# Patient Record
Sex: Female | Born: 1970 | Race: White | Hispanic: No | Marital: Single | Smoking: Former smoker
Health system: Southern US, Community
[De-identification: ages and names within clinical notes are randomized; demographics above are authoritative.]

## PROBLEM LIST (undated history)

## (undated) DIAGNOSIS — T4145XA Adverse effect of unspecified anesthetic, initial encounter: Secondary | ICD-10-CM

## (undated) DIAGNOSIS — R112 Nausea with vomiting, unspecified: Secondary | ICD-10-CM

## (undated) DIAGNOSIS — Z9889 Other specified postprocedural states: Secondary | ICD-10-CM

## (undated) DIAGNOSIS — I10 Essential (primary) hypertension: Secondary | ICD-10-CM

## (undated) DIAGNOSIS — Z8639 Personal history of other endocrine, nutritional and metabolic disease: Secondary | ICD-10-CM

## (undated) DIAGNOSIS — T8859XA Other complications of anesthesia, initial encounter: Secondary | ICD-10-CM

## (undated) DIAGNOSIS — M1990 Unspecified osteoarthritis, unspecified site: Secondary | ICD-10-CM

---

## 2003-04-05 ENCOUNTER — Encounter: Admission: RE | Admit: 2003-04-05 | Discharge: 2003-05-05 | Payer: Self-pay | Admitting: Occupational Medicine

## 2005-06-12 ENCOUNTER — Emergency Department (HOSPITAL_COMMUNITY): Admission: EM | Admit: 2005-06-12 | Discharge: 2005-06-12 | Payer: Self-pay | Admitting: Emergency Medicine

## 2005-09-17 ENCOUNTER — Emergency Department (HOSPITAL_COMMUNITY): Admission: EM | Admit: 2005-09-17 | Discharge: 2005-09-17 | Payer: Self-pay | Admitting: Family Medicine

## 2006-08-09 ENCOUNTER — Emergency Department: Payer: Self-pay | Admitting: Emergency Medicine

## 2007-01-09 ENCOUNTER — Ambulatory Visit: Payer: Self-pay | Admitting: Emergency Medicine

## 2008-02-27 ENCOUNTER — Ambulatory Visit: Payer: Self-pay | Admitting: Family Medicine

## 2008-02-29 ENCOUNTER — Ambulatory Visit: Payer: Self-pay | Admitting: Family Medicine

## 2008-03-02 ENCOUNTER — Ambulatory Visit: Payer: Self-pay | Admitting: Family Medicine

## 2008-04-21 ENCOUNTER — Ambulatory Visit: Payer: Self-pay | Admitting: Family Medicine

## 2009-12-01 ENCOUNTER — Ambulatory Visit: Payer: Self-pay | Admitting: Family Medicine

## 2009-12-01 DIAGNOSIS — J209 Acute bronchitis, unspecified: Secondary | ICD-10-CM | POA: Insufficient documentation

## 2009-12-01 DIAGNOSIS — J019 Acute sinusitis, unspecified: Secondary | ICD-10-CM | POA: Insufficient documentation

## 2010-07-23 NOTE — Assessment & Plan Note (Signed)
Summary: URI/JBB   Vital Signs:  Patient Profile:   40 Years Old Female CC:      head and chest congestion Height:     64.75 inches Weight:      279 pounds Temp:     99.5 degrees F oral Pulse rate:   80 / minute Pulse rhythm:   regular Resp:     20 per minute BP sitting:   134 / 70  (right arm) Cuff size:   large  Vitals Entered By: Providence Crosby LPN (December 01, 2009 12:35 PM)                  Current Allergies: ! PENICILLIN ! SULFA ! CODEINEHistory of Present Illness Reason for visit: cough and congestion Chief Complaint: head and chest congestion History of Present Illness: started sunday last week states i can not go anymore cough productive with phlegm greenish in color now per patient  REVIEW OF SYSTEMS Constitutional Symptoms       Complains of fever, chills, night sweats, weight gain, and fatigue.     Denies weight loss.  Eyes       Denies change in vision, eye pain, eye discharge, glasses, contact lenses, and eye surgery. Ear/Nose/Throat/Mouth       Complains of frequent runny nose, sinus problems, sore throat, and hoarseness.      Denies hearing loss/aids, change in hearing, ear pain, ear discharge, dizziness, frequent nose bleeds, and tooth pain or bleeding.  Respiratory       Complains of dry cough and productive cough.      Denies asthma, bronchitis, and emphysema/COPD.      Comments: shortness of breath Cardiovascular       Denies murmurs, chest pain, and tires easily with exhertion.    Gastrointestinal       Complains of nausea/vomiting and diarrhea.      Denies stomach pain, constipation, blood in bowel movements, and indigestion. Genitourniary       Denies painful urination, blood or discharge from vagina, kidney stones, and loss of urinary control. Neurological       Denies headaches, loss of or changes in sensation, numbness, tngling, tremors, paralysis, seizures, and fainting/blackouts. Musculoskeletal       Denies muscle pain, joint pain, joint  stiffness, decreased range of motion, redness, swelling, muscle weakness, and gout.  Skin       Denies bruising, unusual mles/lumps or sores, and hair/skin or nail changes.  Psych       Denies mood changes, temper/anger issues, anxiety/stress, speech problems, depression, and sleep problems.  Family History: Father: Deceased at age 70- copd; MI CHF Mother: Deceased at age 8 ; HTN, MI, CABG Siblings: 1 BROTHER DECEASED - diabetic; mi; chf 1 sister alive with osteoporosis 4 Brothers alive and well  Social History: Marital Status: Children:  Occupation: Engineer, materials on Engineer, water at [employer] Physical Exam General appearance: obese, well developed, well nourished, no acute distress Head: normocephalic, atraumatic Eyes: conjunctivae and lids normal Pupils: equal, round, reactive to light Ears: inflamed right TM Nasal: swollen red turbinates with congestion Oral/Pharynx: pharyngeal erythema without exudate, uvula midline without deviation Neck: neck supple,  trachea midline, no masses Thyroid: no nodules, masses, tenderness, or enlargement Chest/Lungs: no rales, wheezes, or rhonchi bilateral, breath sounds equal without effort Heart: regular rate and  rhythm, no murmur GU: deferred Extremities: Trace edema Skin: no obvious rashes or lesions MSE: oriented to time, place, and person Assessment New Problems: SINUSITIS, ACUTE (ICD-461.9) BRONCHITIS,  ACUTE (ICD-466.0)   Plan New Medications/Changes: ZOFRAN 4 MG TABS (ONDANSETRON HCL) 1 tablet every 6 to 8 hours as needed nausea  #20 x 0, 12/01/2009, Genella Mech DOXYCYCL HYC  100MG  (DOXYCYCLINE HYCLATE) TAKE 1 CAP PO TWICE A DAY X 10 DAYS  #20 x 0, 12/01/2009, Genella Mech   The patient and/or caregiver has been counseled thoroughly with regard to medications prescribed including dosage, schedule, interactions, rationale for use, and possible side effects and they verbalize understanding.  Diagnoses and expected course of recovery  discussed and will return if not improved as expected or if the condition worsens. Patient and/or caregiver verbalized understanding.  Prescriptions: ZOFRAN 4 MG TABS (ONDANSETRON HCL) 1 tablet every 6 to 8 hours as needed nausea  #20 x 0   Entered and Authorized by:   Kathrynn Running MD   Signed by:   Genella Mech on 12/01/2009   Method used:   Telephoned to ...       [facility] [address] [phone]* (retail)       [address] [place], [place]  [address]       Ph: [phone]       Fax: [phone]   RxID:   [id number] DOXYCYCL HYC  100MG  (DOXYCYCLINE HYCLATE) TAKE 1 CAP PO TWICE A DAY X 10 DAYS  #20 x 0   Entered and Authorized by:   Kathrynn Running MD   Signed by:   Genella Mech on 12/01/2009   Method used:   Telephoned to ...       [facility] [address] [phone]* (retail)       [address], [place]  [address]       Ph: [phone]       Fax: [phone]   RxID:   [id number]  The patient was informed that there is no on-call provider or services available at this clinic during off-hours (when the clinic is closed).  If the patient developed a problem or concern that required immediate attention, the patient was advised to go the the nearest available urgent care or emergency department for medical care.  The patient verbalized understanding.     The risks, benefits and possible side effects were clearly explained and discussed with the patient.  The patient verbalized clear understanding.  The patient was given instructions to return if symptoms don't improve, worsen or new changes develop.  If it is not during clinic hours and the patient cannot get back to this clinic then the patient was told to seek medical care at an available urgent care or emergency department.  The patient verbalized understanding.    The patient's prescriptions were checked for possible interactions and electronically sent to the pharmacy of choice.   I have reviewed the above medical  office visit documention, including diagnoses, history, medications, clinical lists, orders and plan of care.   Rodney Langton, MD, FAAFP  December 17, 2009 Added new allergy or adverse reaction of PENICILLIN - Signed Added new allergy or adverse reaction of SULFA - Signed Added new allergy or adverse reaction of CODEINE - Signed

## 2010-11-19 ENCOUNTER — Inpatient Hospital Stay (INDEPENDENT_AMBULATORY_CARE_PROVIDER_SITE_OTHER)
Admission: RE | Admit: 2010-11-19 | Discharge: 2010-11-19 | Disposition: A | Discharge status: Home / Self Care | Payer: 59 | Source: Ambulatory Visit | Attending: Family Medicine | Admitting: Family Medicine

## 2010-11-19 DIAGNOSIS — H6690 Otitis media, unspecified, unspecified ear: Secondary | ICD-10-CM

## 2011-10-23 ENCOUNTER — Encounter (HOSPITAL_COMMUNITY): Payer: Self-pay | Admitting: Emergency Medicine

## 2011-10-23 ENCOUNTER — Emergency Department (INDEPENDENT_AMBULATORY_CARE_PROVIDER_SITE_OTHER): Payer: 59

## 2011-10-23 ENCOUNTER — Emergency Department (HOSPITAL_COMMUNITY)
Admission: EM | Admit: 2011-10-23 | Discharge: 2011-10-23 | Disposition: A | Discharge status: Home / Self Care | Payer: 59 | Source: Home / Self Care | Attending: Family Medicine | Admitting: Family Medicine

## 2011-10-23 DIAGNOSIS — M79609 Pain in unspecified limb: Secondary | ICD-10-CM

## 2011-10-23 DIAGNOSIS — M79644 Pain in right finger(s): Secondary | ICD-10-CM

## 2011-10-23 HISTORY — DX: Essential (primary) hypertension: I10

## 2011-10-23 IMAGING — CR DG HAND COMPLETE 3+V*R*
3 series · 3 of 3 positions shown · non-contrast
Comparison: No priors.

***ADDENDUM*** CREATED: [DATE] [DATE]

Due to an administrative error, the original exam contained the
incorrect exam description in the header.  The correct description
for this exam is as follows.
DG HAND COMPLETE RIGHT
***END ADDENDUM*** SIGNED BY: WIELAND, M.D.
CLINICAL DATA: Right-sided hand pain.  Pain.
RIGHT HAND - COMPLETE 3+ VIEW

[view not recorded (1 of 3)]
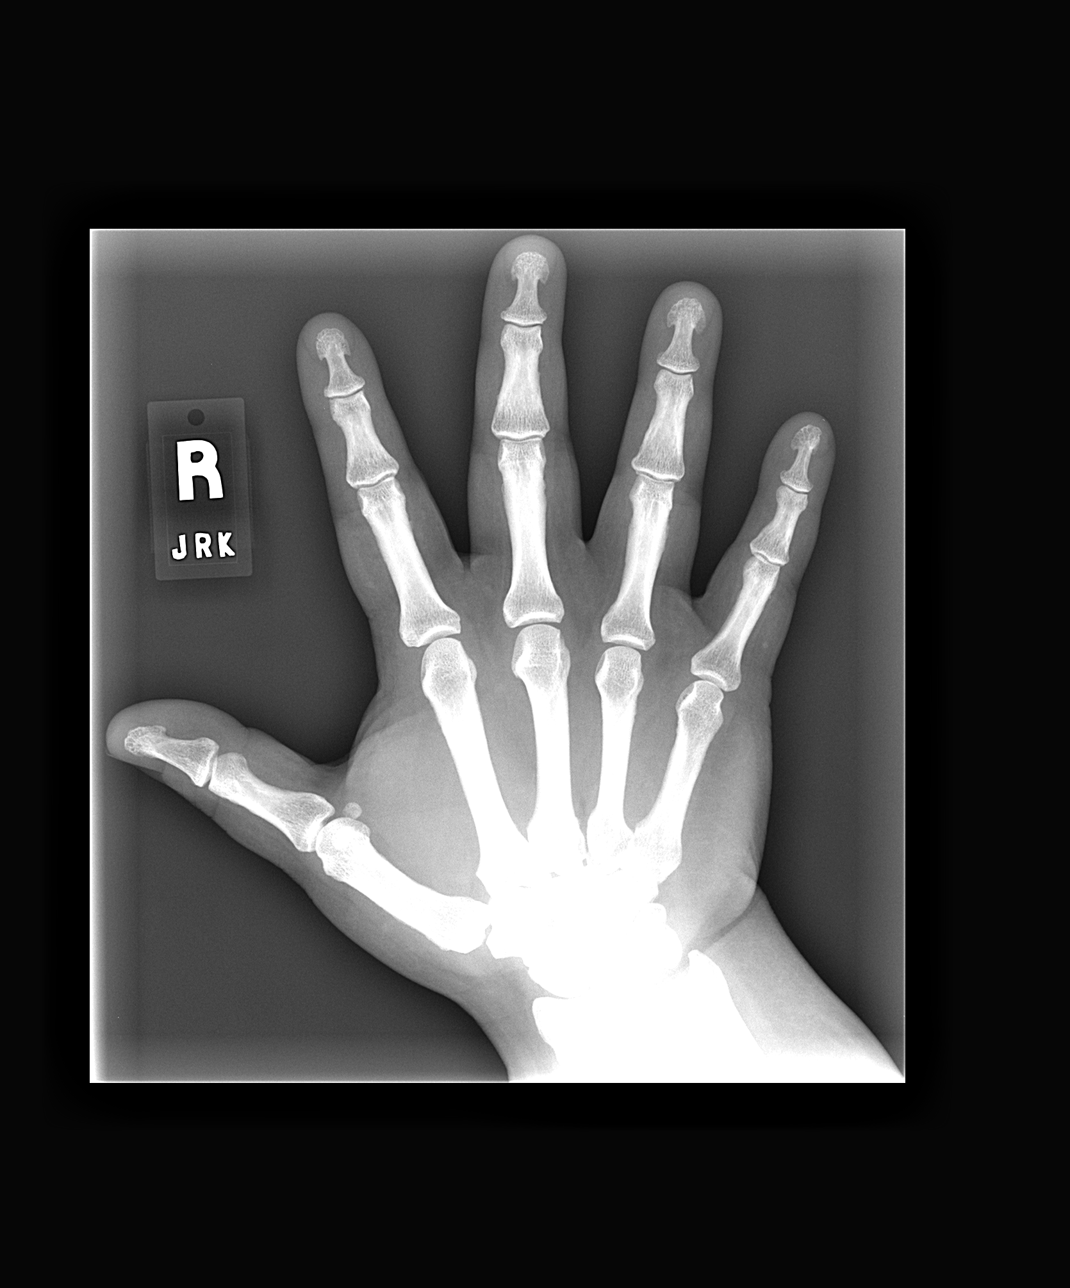

[view not recorded (2 of 3)]
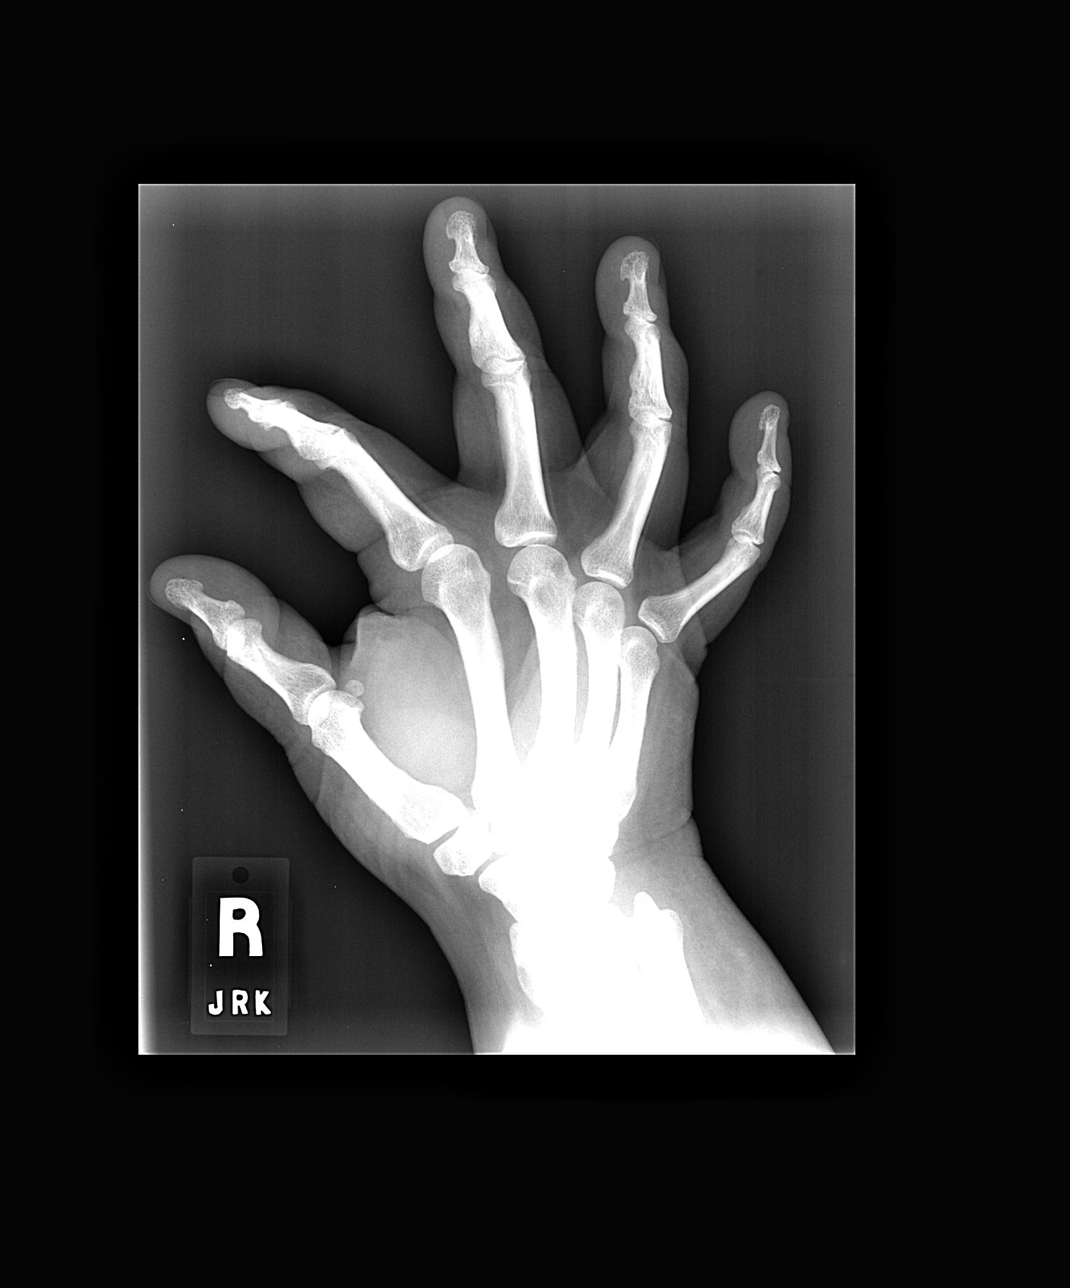

[view not recorded (3 of 3)]
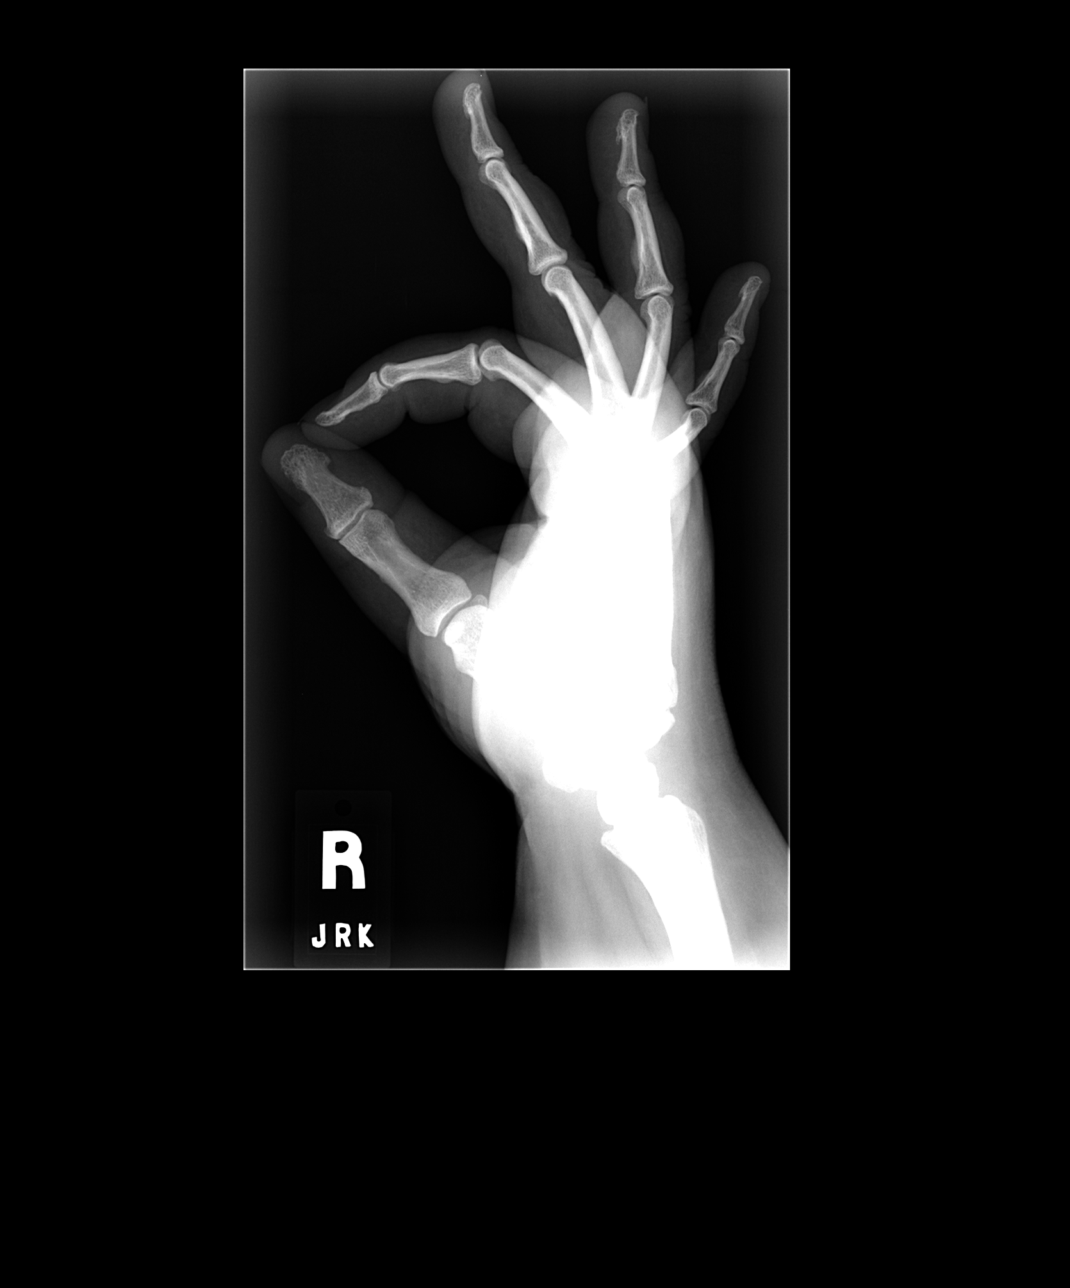

[3 of 3 positions shown; findings below may reference images not displayed]

FINDINGS: Three views of the right hand demonstrate no acute
fracture, subluxation, dislocation, joint or soft tissue
abnormality.
IMPRESSION: 1.  No acute radiographic abnormality of the right hand.

## 2011-10-23 MED ORDER — ETODOLAC 400 MG PO TABS: 400.0000 mg | ORAL_TABLET | Freq: Two times a day (BID) | ORAL | Status: AC

## 2011-10-23 NOTE — ED Notes (Addendum)
HERE WITH RIGHT THUMB IRRITATION THAT STARTED ABOUT A WEEK AGO WITH POPPING WHEN BENDING AND FOR THE PAST 2 NIGHT SORENESS AT THE JOINT AND ITCHINESS.NO REDNESS OR SWELLING SEEN.IBUPROFEN TAKEN FOR DISCOMFORT

## 2011-10-23 NOTE — ED Provider Notes (Signed)
History     CSN: [id number]  Arrival date & time 10/23/11  0808   First MD Initiated Contact with Patient 10/23/11 [phone]      Chief Complaint  Patient presents with  . Hand Pain    (Consider location/radiation/quality/duration/timing/severity/associated sxs/prior treatment) HPI Comments: Patient reports having pain in her right thumb for the past 2 nights. In the am's she has to work with it to get it to bend at the ip joint. No known injury. No numbness or tingling. She is right and dom. States her thumb has been itching but no rash noted, no tx pta other than taking a few ibuprofen.   The history is provided by the patient.    Past Medical History  Diagnosis Date  . Diabetes mellitus   . Hypertension     History reviewed. No pertinent past surgical history.  History reviewed. No pertinent family history.  History  Substance Use Topics  . Smoking status: Never Smoker   . Smokeless tobacco: Not on file  . Alcohol Use: No    OB History    Grav Para Term Preterm Abortions TAB SAB Ect Mult Living                  Review of Systems  Constitutional: Negative.   HENT: Negative.   Respiratory: Negative.   Cardiovascular: Negative.   Gastrointestinal: Negative.   Genitourinary: Negative.   Skin: Negative.     Allergies  Codeine; Penicillins; and Sulfonamide derivatives  Home Medications   Current Outpatient Rx  Name Route Sig Dispense Refill  . OMEGA-3 FATTY ACIDS 1000 MG PO CAPS Oral Take 2 g by mouth daily.    Marland Kitchen LISINOPRIL 20 MG PO TABS Oral Take 20 mg by mouth daily.    Marland Kitchen LOVASTATIN 20 MG PO TABS Oral Take 20 mg by mouth at bedtime.    Marland Kitchen METFORMIN HCL 1000 MG PO TABS Oral Take 1,000 mg by mouth 2 (two) times daily with a meal.    . ETODOLAC 400 MG PO TABS Oral Take 1 tablet (400 mg total) by mouth 2 (two) times daily. 20 tablet 3    There were no vitals taken for this visit.  Physical Exam  Nursing note and vitals reviewed. Constitutional: She appears  well-developed and well-nourished.  Cardiovascular: Normal rate and regular rhythm.   Pulmonary/Chest: Effort normal.  Musculoskeletal:       cogwheel rom of the right thumb . No swelling or deformity. N/v intact  Skin: Skin is warm and dry.    ED Course  Procedures (including critical care time)  Labs Reviewed - No data to display No results found.   1. Thumb pain, right       MDM          Randa Spike, MD 10/23/11 1001

## 2011-10-23 NOTE — Discharge Instructions (Signed)
Wear the splint 24/7. Soak in warm water twice daily x 15 min. Follow up with ortho hand if symptoms persist.

## 2011-12-19 ENCOUNTER — Emergency Department: Payer: Self-pay | Admitting: Emergency Medicine

## 2012-04-12 ENCOUNTER — Emergency Department: Payer: Self-pay | Admitting: Emergency Medicine

## 2012-04-13 LAB — COMPREHENSIVE METABOLIC PANEL
Albumin: 3.9 g/dL (ref 3.4–5.0)
Alkaline Phosphatase: 96 U/L (ref 50–136)
BUN: 9 mg/dL (ref 7–18)
Calcium, Total: 9.1 mg/dL (ref 8.5–10.1)
Co2: 28 mmol/L (ref 21–32)
Creatinine: 0.62 mg/dL (ref 0.60–1.30)
SGOT(AST): 21 U/L (ref 15–37)
SGPT (ALT): 31 U/L (ref 12–78)
Sodium: 136 mmol/L (ref 136–145)

## 2012-04-13 LAB — CBC
HCT: 45.9 % (ref 35.0–47.0)
MCHC: 33.5 g/dL (ref 32.0–36.0)
Platelet: 412 10*3/uL (ref 150–440)
RDW: 13.7 % (ref 11.5–14.5)
WBC: 11.8 10*3/uL — ABNORMAL HIGH (ref 3.6–11.0)

## 2012-04-13 LAB — URINALYSIS, COMPLETE
Glucose,UR: 150 mg/dL (ref 0–75)
Protein: NEGATIVE
Squamous Epithelial: 2

## 2012-05-04 ENCOUNTER — Ambulatory Visit: Payer: Self-pay | Admitting: Internal Medicine

## 2013-05-12 ENCOUNTER — Ambulatory Visit: Payer: Self-pay | Admitting: Internal Medicine

## 2013-12-25 ENCOUNTER — Emergency Department: Payer: Self-pay | Admitting: Emergency Medicine

## 2014-06-13 ENCOUNTER — Ambulatory Visit: Payer: Self-pay | Admitting: Internal Medicine

## 2014-12-13 HISTORY — PX: GASTRIC BYPASS: SHX52

## 2015-05-23 ENCOUNTER — Other Ambulatory Visit: Payer: Self-pay | Admitting: Internal Medicine

## 2015-05-23 DIAGNOSIS — Z1231 Encounter for screening mammogram for malignant neoplasm of breast: Secondary | ICD-10-CM

## 2015-07-03 ENCOUNTER — Ambulatory Visit
Admission: RE | Admit: 2015-07-03 | Discharge: 2015-07-03 | Disposition: A | Discharge status: Home / Self Care | Payer: BC Managed Care – PPO | Source: Ambulatory Visit | Attending: Internal Medicine | Admitting: Internal Medicine

## 2015-07-03 DIAGNOSIS — Z1231 Encounter for screening mammogram for malignant neoplasm of breast: Secondary | ICD-10-CM | POA: Diagnosis present

## 2015-07-10 ENCOUNTER — Emergency Department
Admission: EM | Admit: 2015-07-10 | Discharge: 2015-07-10 | Disposition: A | Discharge status: Home / Self Care | Payer: BC Managed Care – PPO | Attending: Emergency Medicine | Admitting: Emergency Medicine

## 2015-07-10 ENCOUNTER — Encounter: Payer: Self-pay | Admitting: Emergency Medicine

## 2015-07-10 DIAGNOSIS — J0191 Acute recurrent sinusitis, unspecified: Secondary | ICD-10-CM | POA: Insufficient documentation

## 2015-07-10 DIAGNOSIS — Z7984 Long term (current) use of oral hypoglycemic drugs: Secondary | ICD-10-CM | POA: Diagnosis not present

## 2015-07-10 DIAGNOSIS — I10 Essential (primary) hypertension: Secondary | ICD-10-CM | POA: Insufficient documentation

## 2015-07-10 DIAGNOSIS — R42 Dizziness and giddiness: Secondary | ICD-10-CM | POA: Diagnosis not present

## 2015-07-10 DIAGNOSIS — R0981 Nasal congestion: Secondary | ICD-10-CM | POA: Diagnosis present

## 2015-07-10 DIAGNOSIS — E119 Type 2 diabetes mellitus without complications: Secondary | ICD-10-CM | POA: Diagnosis not present

## 2015-07-10 DIAGNOSIS — Z79899 Other long term (current) drug therapy: Secondary | ICD-10-CM | POA: Insufficient documentation

## 2015-07-10 DIAGNOSIS — Z880 Allergy status to penicillin: Secondary | ICD-10-CM | POA: Insufficient documentation

## 2015-07-10 LAB — CBC
HCT: 40.5 % (ref 35.0–47.0)
Hemoglobin: 13.3 g/dL (ref 12.0–16.0)
MCH: 27.6 pg (ref 26.0–34.0)
MCHC: 32.8 g/dL (ref 32.0–36.0)
MCV: 84.2 fL (ref 80.0–100.0)
PLATELETS: 337 10*3/uL (ref 150–440)
RBC: 4.81 MIL/uL (ref 3.80–5.20)
RDW: 15 % — AB (ref 11.5–14.5)
WBC: 8.8 10*3/uL (ref 3.6–11.0)

## 2015-07-10 LAB — BASIC METABOLIC PANEL
ANION GAP: 8 (ref 5–15)
BUN: 8 mg/dL (ref 6–20)
CALCIUM: 9.4 mg/dL (ref 8.9–10.3)
CO2: 31 mmol/L (ref 22–32)
CREATININE: 0.56 mg/dL (ref 0.44–1.00)
Chloride: 97 mmol/L — ABNORMAL LOW (ref 101–111)
GFR calc Af Amer: >60 mL/min (ref >60)
GLUCOSE: 124 mg/dL — AB (ref 65–99)
Potassium: 3.3 mmol/L — ABNORMAL LOW (ref 3.5–5.1)
Sodium: 136 mmol/L (ref 135–145)

## 2015-07-10 LAB — TROPONIN I: Troponin I: <0.03 ng/mL (ref <0.031)

## 2015-07-10 MED ORDER — ONDANSETRON HCL 4 MG/2ML IJ SOLN
4.0000 mg | Freq: Once | INTRAMUSCULAR | Status: AC
  Administered 2015-07-10: 4 mg via INTRAVENOUS
  Filled 2015-07-10: qty 2

## 2015-07-10 MED ORDER — SODIUM CHLORIDE 0.9 % IV BOLUS (SEPSIS)
1000.0000 mL | Freq: Once | INTRAVENOUS | Status: AC
  Administered 2015-07-10: 1000 mL via INTRAVENOUS

## 2015-07-10 MED ORDER — MECLIZINE HCL 25 MG PO TABS
25.0000 mg | ORAL_TABLET | Freq: Once | ORAL | Status: AC
  Administered 2015-07-10: 25 mg via ORAL
  Filled 2015-07-10: qty 1

## 2015-07-10 MED ORDER — MECLIZINE HCL 25 MG PO TABS: 25.0000 mg | ORAL_TABLET | Freq: Three times a day (TID) | ORAL | Status: DC | PRN

## 2015-07-10 MED ORDER — AZITHROMYCIN 250 MG PO TABS: ORAL_TABLET | ORAL | Status: DC
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
## 2015-07-10 NOTE — ED Notes (Addendum)
Patient ambulatory to triage with steady gait, without difficulty or distress noted; pale color somewhat pale; pt reports nausea & dizziness upon awakening this morning; st hx inner ear infections & sinus congestion last week

## 2015-07-10 NOTE — ED Notes (Signed)
While performing EKG, noted HR 44-47; acuity level changed; pt reports HR normally 60-80s

## 2015-07-10 NOTE — Discharge Instructions (Signed)
Dizziness °Dizziness is a common problem. It makes you feel unsteady or lightheaded. You may feel like you are about to pass out (faint). Dizziness can lead to injury if you stumble or fall. Anyone can get dizzy, but dizziness is more common in older adults. This condition can be caused by a number of things, including: °· Medicines. °· Dehydration. °· Illness. °HOME CARE °Following these instructions may help with your condition: °Eating and Drinking °· Drink enough fluid to keep your pee (urine) clear or pale yellow. This helps to keep you from getting dehydrated. Try to drink more clear fluids, such as water. °· Do not drink alcohol. °· Limit how much caffeine you drink or eat if told by your doctor. °· Limit how much salt you drink or eat if told by your doctor. °Activity °· Avoid making quick movements. °¨ When you stand up from sitting in a chair, steady yourself until you feel okay. °¨ In the morning, first sit up on the side of the bed. When you feel okay, stand slowly while you hold onto something. Do this until you know that your balance is fine. °· Move your legs often if you need to stand in one place for a long time. Tighten and relax your muscles in your legs while you are standing. °· Do not drive or use heavy machinery if you feel dizzy. °· Avoid bending down if you feel dizzy. Place items in your home so that they are easy for you to reach without leaning over. °Lifestyle °· Do not use any tobacco products, including cigarettes, chewing tobacco, or electronic cigarettes. If you need help quitting, ask your doctor. °· Try to lower your stress level, such as with yoga or meditation. Talk with your doctor if you need help. °General Instructions °· Watch your dizziness for any changes. °· Take medicines only as told by your doctor. Talk with your doctor if you think that your dizziness is caused by a medicine that you are taking. °· Tell a friend or a family member that you are feeling dizzy. If he or  she notices any changes in your behavior, have this person call your doctor. °· Keep all follow-up visits as told by your doctor. This is important. °GET HELP IF: °· Your dizziness does not go away. °· Your dizziness or light-headedness gets worse. °· You feel sick to your stomach (nauseous). °· You have trouble hearing. °· You have new symptoms. °· You are unsteady on your feet or you feel like the room is spinning. °GET HELP RIGHT AWAY IF: °· You throw up (vomit) or have diarrhea and are unable to eat or drink anything. °· You have trouble: °¨ Talking. °¨ Walking. °¨ Swallowing. °¨ Using your arms, hands, or legs. °· You feel generally weak. °· You are not thinking clearly or you have trouble forming sentences. It may take a friend or family member to notice this. °· You have: °¨ Chest pain. °¨ Pain in your belly (abdomen). °¨ Shortness of breath. °¨ Sweating. °· Your vision changes. °· You are bleeding. °· You have a headache. °· You have neck pain or a stiff neck. °· You have a fever. °  °This information is not intended to replace advice given to you by your health care provider. Make sure you discuss any questions you have with your health care provider. °  °Document Released: 05/29/2011 Document Revised: 10/24/2014 Document Reviewed: 06/05/2014 °Elsevier Interactive Patient Education ©2016 [employer] ° °

## 2015-11-26 ENCOUNTER — Encounter
Admission: RE | Admit: 2015-11-26 | Discharge: 2015-11-26 | Disposition: A | Discharge status: Home / Self Care | Payer: BC Managed Care – PPO | Source: Ambulatory Visit | Attending: Obstetrics and Gynecology | Admitting: Obstetrics and Gynecology

## 2015-11-26 DIAGNOSIS — Z9884 Bariatric surgery status: Secondary | ICD-10-CM | POA: Diagnosis not present

## 2015-11-26 DIAGNOSIS — Z87891 Personal history of nicotine dependence: Secondary | ICD-10-CM | POA: Diagnosis not present

## 2015-11-26 DIAGNOSIS — N8501 Benign endometrial hyperplasia: Secondary | ICD-10-CM | POA: Diagnosis not present

## 2015-11-26 DIAGNOSIS — E7800 Pure hypercholesterolemia, unspecified: Secondary | ICD-10-CM | POA: Diagnosis not present

## 2015-11-26 DIAGNOSIS — E119 Type 2 diabetes mellitus without complications: Secondary | ICD-10-CM | POA: Diagnosis not present

## 2015-11-26 DIAGNOSIS — N840 Polyp of corpus uteri: Secondary | ICD-10-CM | POA: Diagnosis not present

## 2015-11-26 DIAGNOSIS — N920 Excessive and frequent menstruation with regular cycle: Secondary | ICD-10-CM | POA: Diagnosis present

## 2015-11-26 DIAGNOSIS — I10 Essential (primary) hypertension: Secondary | ICD-10-CM | POA: Diagnosis not present

## 2015-11-26 HISTORY — DX: Personal history of other endocrine, nutritional and metabolic disease: Z86.39

## 2015-11-26 LAB — BASIC METABOLIC PANEL
Anion gap: 8 (ref 5–15)
BUN: 17 mg/dL (ref 6–20)
CHLORIDE: 102 mmol/L (ref 101–111)
CO2: 27 mmol/L (ref 22–32)
CREATININE: 0.61 mg/dL (ref 0.44–1.00)
Calcium: 9.6 mg/dL (ref 8.9–10.3)
GFR calc Af Amer: >60 mL/min (ref >60)
GFR calc non Af Amer: >60 mL/min (ref >60)
Glucose, Bld: 101 mg/dL — ABNORMAL HIGH (ref 65–99)
Potassium: 3.8 mmol/L (ref 3.5–5.1)
SODIUM: 137 mmol/L (ref 135–145)

## 2015-11-26 LAB — CBC
HEMATOCRIT: 41.6 % (ref 35.0–47.0)
HEMOGLOBIN: 13.9 g/dL (ref 12.0–16.0)
MCH: 29.2 pg (ref 26.0–34.0)
MCHC: 33.4 g/dL (ref 32.0–36.0)
MCV: 87.5 fL (ref 80.0–100.0)
Platelets: 392 10*3/uL (ref 150–440)
RBC: 4.75 MIL/uL (ref 3.80–5.20)
RDW: 13.6 % (ref 11.5–14.5)
WBC: 10.3 10*3/uL (ref 3.6–11.0)

## 2015-11-26 NOTE — Patient Instructions (Signed)
  Your procedure is scheduled on:November 27, 2015 (Tuesday) Report to Same Day Surgery 2nd floor Medical Mall To find out your arrival time please call [phone] between 1PM - 3PM on ARRIVAL TIME 9:30 AM  Remember: Instructions that are not followed completely may result in serious medical risk, up to and including death, or upon the discretion of your surgeon and anesthesiologist your surgery may need to be rescheduled.    _x___ 1. Do not eat food or drink liquids after midnight. No gum chewing or hard candies.     __x__ 2. No Alcohol for 24 hours before or after surgery.   ____ 3. Bring all medications with you on the day of surgery if instructed.    __x__ 4. Notify your doctor if there is any change in your medical condition     (cold, fever, infections).     Do not wear jewelry, make-up, hairpins, clips or nail polish.  Do not wear lotions, powders, or perfumes. You may wear deodorant.  Do not shave 48 hours prior to surgery. Men may shave face and neck.  Do not bring valuables to the hospital.    [facility] is not responsible for any belongings or valuables.               Contacts, dentures or bridgework may not be worn into surgery.  Leave your suitcase in the car. After surgery it may be brought to your room.  For patients admitted to the hospital, discharge time is determined by your treatment team.   Patients discharged the day of surgery will not be allowed to drive home.    Please read over the following fact sheets that you were given:   [facility] Preparing for Surgery and or MRSA Information   ___ Take these medicines the morning of surgery with A SIP OF WATER:    1.   2.  3.  4.  5.  6.  ____ Fleet Enema (as directed)   ___ Use CHG Soap or sage wipes as directed on instruction sheet   ____ Use inhalers on the day of surgery and bring to hospital day of surgery  ____ Stop metformin 2 days prior to surgery    ____ Take 1/2 of usual insulin dose the night  before surgery and none on the morning of  Surgery        _x___ Stop aspirin or coumadin, or plavix (N/A)  _x__ Stop Anti-inflammatories such as Advil, Aleve, Ibuprofen, Motrin, Naproxen,          Naprosyn, Goodies powders or aspirin products. Ok to take Tylenol.   _x__ Stop supplements until after surgery.    ____ Bring C-Pap to the hospital.

## 2015-11-27 ENCOUNTER — Ambulatory Visit
Admission: RE | Admit: 2015-11-27 | Discharge: 2015-11-27 | Disposition: A | Discharge status: Home / Self Care | Payer: BC Managed Care – PPO | Source: Ambulatory Visit | Attending: Obstetrics and Gynecology | Admitting: Obstetrics and Gynecology

## 2015-11-27 ENCOUNTER — Ambulatory Visit: Payer: BC Managed Care – PPO | Admitting: Certified Registered Nurse Anesthetist

## 2015-11-27 ENCOUNTER — Encounter: Payer: Self-pay | Admitting: *Deleted

## 2015-11-27 ENCOUNTER — Encounter
Admission: RE | Disposition: A | Discharge status: Home / Self Care | Payer: Self-pay | Source: Ambulatory Visit | Attending: Obstetrics and Gynecology

## 2015-11-27 DIAGNOSIS — Z9889 Other specified postprocedural states: Secondary | ICD-10-CM

## 2015-11-27 DIAGNOSIS — Z87891 Personal history of nicotine dependence: Secondary | ICD-10-CM | POA: Insufficient documentation

## 2015-11-27 DIAGNOSIS — N8501 Benign endometrial hyperplasia: Secondary | ICD-10-CM | POA: Diagnosis not present

## 2015-11-27 DIAGNOSIS — E7800 Pure hypercholesterolemia, unspecified: Secondary | ICD-10-CM | POA: Insufficient documentation

## 2015-11-27 DIAGNOSIS — E119 Type 2 diabetes mellitus without complications: Secondary | ICD-10-CM | POA: Insufficient documentation

## 2015-11-27 DIAGNOSIS — Z9884 Bariatric surgery status: Secondary | ICD-10-CM | POA: Insufficient documentation

## 2015-11-27 DIAGNOSIS — N840 Polyp of corpus uteri: Secondary | ICD-10-CM | POA: Insufficient documentation

## 2015-11-27 DIAGNOSIS — I10 Essential (primary) hypertension: Secondary | ICD-10-CM | POA: Insufficient documentation

## 2015-11-27 DIAGNOSIS — N920 Excessive and frequent menstruation with regular cycle: Secondary | ICD-10-CM | POA: Insufficient documentation

## 2015-11-27 HISTORY — PX: HYSTEROSCOPY W/D&C: SHX1775

## 2015-11-27 LAB — POCT PREGNANCY, URINE: Preg Test, Ur: NEGATIVE

## 2015-11-27 SURGERY — DILATATION AND CURETTAGE /HYSTEROSCOPY
Anesthesia: General | Site: Vagina | Wound class: Clean Contaminated

## 2015-11-27 MED ORDER — MIDAZOLAM HCL 2 MG/2ML IJ SOLN
INTRAMUSCULAR | Status: DC | PRN
  Administered 2015-11-27: 2 mg via INTRAVENOUS

## 2015-11-27 MED ORDER — FENTANYL CITRATE (PF) 100 MCG/2ML IJ SOLN: 25.0000 ug | INTRAMUSCULAR | Status: DC | PRN

## 2015-11-27 MED ORDER — ACETAMINOPHEN 10 MG/ML IV SOLN
INTRAVENOUS | Status: AC
  Filled 2015-11-27: qty 100

## 2015-11-27 MED ORDER — ONDANSETRON HCL 4 MG/2ML IJ SOLN
INTRAMUSCULAR | Status: DC | PRN
  Administered 2015-11-27: 4 mg via INTRAVENOUS

## 2015-11-27 MED ORDER — PROPOFOL 500 MG/50ML IV EMUL
INTRAVENOUS | Status: DC | PRN
  Administered 2015-11-27: 100 ug/kg/min via INTRAVENOUS

## 2015-11-27 MED ORDER — ONDANSETRON HCL 4 MG/2ML IJ SOLN: 4.0000 mg | Freq: Once | INTRAMUSCULAR | Status: DC | PRN

## 2015-11-27 MED ORDER — FENTANYL CITRATE (PF) 100 MCG/2ML IJ SOLN
INTRAMUSCULAR | Status: DC | PRN
  Administered 2015-11-27 (×2): 50 ug via INTRAVENOUS
  Administered 2015-11-27 (×2): 25 ug via INTRAVENOUS

## 2015-11-27 MED ORDER — GLYCOPYRROLATE 0.2 MG/ML IJ SOLN
INTRAMUSCULAR | Status: DC | PRN
  Administered 2015-11-27: 0.2 mg via INTRAVENOUS

## 2015-11-27 MED ORDER — HYDROCODONE-ACETAMINOPHEN 5-325 MG PO TABS: 1.0000 | ORAL_TABLET | Freq: Four times a day (QID) | ORAL | Status: DC | PRN

## 2015-11-27 MED ORDER — IBUPROFEN 600 MG PO TABS
600.0000 mg | ORAL_TABLET | Freq: Four times a day (QID) | ORAL | Status: DC | PRN
Start: 2015-11-27 — End: 2016-09-23

## 2015-11-27 MED ORDER — KETOROLAC TROMETHAMINE 30 MG/ML IJ SOLN
INTRAMUSCULAR | Status: DC | PRN
  Administered 2015-11-27: 30 mg via INTRAVENOUS

## 2015-11-27 MED ORDER — DEXAMETHASONE SODIUM PHOSPHATE 10 MG/ML IJ SOLN
INTRAMUSCULAR | Status: DC | PRN
  Administered 2015-11-27: 8 mg via INTRAVENOUS

## 2015-11-27 MED ORDER — LIDOCAINE HCL (CARDIAC) 20 MG/ML IV SOLN
INTRAVENOUS | Status: DC | PRN
  Administered 2015-11-27: 80 mg via INTRAVENOUS

## 2015-11-27 MED ORDER — PROPOFOL 10 MG/ML IV BOLUS
INTRAVENOUS | Status: DC | PRN
  Administered 2015-11-27: 200 mg via INTRAVENOUS

## 2015-11-27 MED ORDER — SODIUM CHLORIDE 0.9 % IV SOLN
INTRAVENOUS | Status: DC
  Administered 2015-11-27: 10:00:00 via INTRAVENOUS

## 2015-11-27 SURGICAL SUPPLY — 16 items
CATH ROBINSON RED A/P 16FR (CATHETERS) ×3 IMPLANT
ELECT REM PT RETURN 9FT ADLT (ELECTROSURGICAL) ×3
ELECTRODE REM PT RTRN 9FT ADLT (ELECTROSURGICAL) ×1 IMPLANT
GLOVE BIO SURGEON STRL SZ7 (GLOVE) ×3 IMPLANT
GLOVE INDICATOR 7.5 STRL GRN (GLOVE) ×3 IMPLANT
GOWN STRL REUS W/ TWL LRG LVL3 (GOWN DISPOSABLE) ×2 IMPLANT
GOWN STRL REUS W/TWL LRG LVL3 (GOWN DISPOSABLE) ×6
IV LACTATED RINGERS 1000ML (IV SOLUTION) ×3 IMPLANT
KIT RM TURNOVER CYSTO AR (KITS) ×3 IMPLANT
MYOSURE LITE POLYP REMOVAL (MISCELLANEOUS) ×2 IMPLANT
PACK DNC HYST (MISCELLANEOUS) ×3 IMPLANT
PAD OB MATERNITY 4.3X12.25 (PERSONAL CARE ITEMS) ×3 IMPLANT
PAD PREP 24X41 OB/GYN DISP (PERSONAL CARE ITEMS) ×3 IMPLANT
TOWEL OR 17X26 4PK STRL BLUE (TOWEL DISPOSABLE) ×3 IMPLANT
TUBING CONNECTING 10 (TUBING) ×2 IMPLANT
TUBING CONNECTING 10' (TUBING) ×1

## 2015-11-27 NOTE — H&P (Signed)
Date of Initial paper H&P: 11/27/2015  History reviewed, patient examined, no change in status, stable for surgery.

## 2015-11-27 NOTE — Anesthesia Preprocedure Evaluation (Signed)
Anesthesia Evaluation  Patient identified by MRN, date of birth, ID band Patient awake    Reviewed: Allergy & Precautions, H&P , NPO status , Patient's Chart, lab work & pertinent test results, reviewed documented beta blocker date and time   Airway Mallampati: II   Neck ROM: full    Dental  (+) Poor Dentition, Teeth Intact, Missing   Pulmonary neg pulmonary ROS, former smoker,    Pulmonary exam normal        Cardiovascular hypertension, negative cardio ROS Normal cardiovascular exam     Neuro/Psych negative neurological ROS  negative psych ROS   GI/Hepatic negative GI ROS, Neg liver ROS,   Endo/Other  negative endocrine ROSdiabetes  Renal/GU negative Renal ROS  negative genitourinary   Musculoskeletal   Abdominal   Peds  Hematology negative hematology ROS (+)   Anesthesia Other Findings Past Medical History:   Hypertension                                                 Diabetes mellitus                                              Comment:diet controlled   H/O hypercholesterolemia                                   Past Surgical History:   GASTRIC BYPASS                                   June 22, *     Comment:[facility]   Reproductive/Obstetrics                             Anesthesia Physical Anesthesia Plan  ASA: II  Anesthesia Plan: General and General LMA   Post-op Pain Management:    Induction:   Airway Management Planned:   Additional Equipment:   Intra-op Plan:   Post-operative Plan:   Informed Consent: I have reviewed the patients History and Physical, chart, labs and discussed the procedure including the risks, benefits and alternatives for the proposed anesthesia with the patient or authorized representative who has indicated his/her understanding and acceptance.   Dental Advisory Given  Plan Discussed with: CRNA  Anesthesia Plan Comments:          Anesthesia Quick Evaluation

## 2015-11-27 NOTE — Transfer of Care (Signed)
Immediate Anesthesia Transfer of Care Note  Patient: Kathryn Owens  Procedure(s) Performed: Procedure(s): DILATATION AND CURETTAGE /HYSTEROSCOPY (N/A)  Patient Location: PACU  Anesthesia Type:General  Level of Consciousness: awake, alert  and oriented  Airway & Oxygen Therapy: Patient Spontanous Breathing and Patient connected to face mask oxygen  Post-op Assessment: Report given to RN and Post -op Vital signs reviewed and stable  Post vital signs: Reviewed and stable  Last Vitals:  Filed Vitals:   11/27/15 0943  BP: 123/77  Pulse: 65  Temp: 36.9 C  Resp: 16    Last Pain: There were no vitals filed for this visit.       Complications: No apparent anesthesia complications

## 2015-11-27 NOTE — Anesthesia Procedure Notes (Signed)
Procedure Name: LMA Insertion Performed by: Mccall Lomax Pre-anesthesia Checklist: Patient identified, Patient being monitored, Timeout performed, Emergency Drugs available and Suction available Patient Re-evaluated:Patient Re-evaluated prior to inductionOxygen Delivery Method: Circle system utilized Preoxygenation: Pre-oxygenation with 100% oxygen Intubation Type: IV induction Ventilation: Mask ventilation without difficulty LMA: LMA inserted LMA Size: 4.0 Tube type: Oral Number of attempts: 1 Placement Confirmation: positive ETCO2 and breath sounds checked- equal and bilateral Tube secured with: Tape Dental Injury: Teeth and Oropharynx as per pre-operative assessment        

## 2015-11-27 NOTE — Op Note (Signed)
Patient Name: Kathryn Owens Date of Procedure: @TODAY @  Preoperative Diagnosis: 1) 45 y.o. 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  Operation Performed: Hysteroscopy, dilation and targeted curettage using Myosure  Indication: Menorrhagia, noted to have simple hyperplasia without atypia on in office biopsy, continued to have bleeding on po provera  Anesthesia: General  Primary Surgeon: Vena AustriaAndreas Chrisie Jankovich, MD  Assistant: none  Preoperative Antibiotics: none  Estimated Blood Loss: minimal  IV Fluids: 60mL  Urine Output:: ~1420mL straight cath  Drains or Tubes: none  Implants: none  Specimens Removed: endometrial curettings  Complications: none  Intraoperative Findings:  Nulliparous cervix, slight stenotic.  Cavity with thickened endometrial lining, some having bad like appearance of synechia but consisting of endometrium most prominent left cornua  Patient Condition: stable  Procedure in Detail:  Patient was taken to the operating room were she was administered general endotracheal anesthesia.  She was positioned in the dorsal lithotomy position utilizing Allen stirups, prepped and draped in the usual sterile fashion.  Uterus was noted to be non-enlarged in size, anteverted.   Prior to proceeding with the case a time out was performed.  Attention was turned to the patient's pelvis.  A red rubber catheter was used to empty the patient's bladder.  An operative speculum was placed to allow visualization of the cervix.  The anterior lip of the cervix was grasped with a single tooth tenaculum and the cervix was sequentially dilated using pratt dilators.  The hysteroscope was then advanced into the uterine cavity noting the above findings.  Given some degree of cervical stenosis decision made to proceed with targeted curettage using Myosure rather than sharp curettage to minimize  the risk of perforation.  The single tooth tenaculum was removed from the cervix.  The tenaculum sites and cervix were noted to be  Hemostatic before removing the operative speculum.  Sponge needle and instrument counts were corrects times two.  The patient tolerated the procedure well and was taken to the recovery room in stable condition.

## 2015-11-27 NOTE — Discharge Instructions (Signed)
AMBULATORY SURGERY  °DISCHARGE INSTRUCTIONS ° ° °1) The drugs that you were given will stay in your system until tomorrow so for the next 24 hours you should not: ° °A) Drive an automobile °B) Make any legal decisions °C) Drink any alcoholic beverage ° ° °2) You may resume regular meals tomorrow.  Today it is better to start with liquids and gradually work up to solid foods. ° °You may eat anything you prefer, but it is better to start with liquids, then soup and crackers, and gradually work up to solid foods. ° ° °3) Please notify your doctor immediately if you have any unusual bleeding, trouble breathing, redness and pain at the surgery site, drainage, fever, or pain not relieved by medication. ° ° ° °4) Additional Instructions: ° ° ° ° ° ° ° °Please contact your physician with any problems or Same Day Surgery at [phone], Monday through Friday 6 am to 4 pm, or [place] [facility] [place] Main number at [phone]. °

## 2015-11-28 LAB — SURGICAL PATHOLOGY

## 2015-11-28 NOTE — Anesthesia Postprocedure Evaluation (Signed)
Anesthesia Post Note  Patient: Kathryn Owens  Procedure(s) Performed: Procedure(s) (LRB): DILATATION AND CURETTAGE /HYSTEROSCOPY (N/A)  Patient location during evaluation: PACU Anesthesia Type: General Level of consciousness: awake and alert Pain management: pain level controlled Vital Signs Assessment: post-procedure vital signs reviewed and stable Respiratory status: spontaneous breathing, nonlabored ventilation, respiratory function stable and patient connected to nasal cannula oxygen Cardiovascular status: blood pressure returned to baseline and stable Postop Assessment: no signs of nausea or vomiting Anesthetic complications: no    Last Vitals:  Filed Vitals:   11/27/15 1321 11/27/15 1400  BP: 112/56 118/57  Pulse: 41 46  Temp: 35.6 C   Resp: 16     Last Pain:  Filed Vitals:   11/28/15 0759  PainSc: 0-No pain                 Yevette EdwardsJames G Mirna Sutcliffe

## 2016-04-08 ENCOUNTER — Other Ambulatory Visit: Payer: Self-pay | Admitting: Internal Medicine

## 2016-04-08 DIAGNOSIS — Z1231 Encounter for screening mammogram for malignant neoplasm of breast: Secondary | ICD-10-CM

## 2016-07-03 ENCOUNTER — Ambulatory Visit
Admission: RE | Admit: 2016-07-03 | Discharge: 2016-07-03 | Disposition: A | Discharge status: Home / Self Care | Payer: BC Managed Care – PPO | Source: Ambulatory Visit | Attending: Internal Medicine | Admitting: Internal Medicine

## 2016-07-03 DIAGNOSIS — Z1231 Encounter for screening mammogram for malignant neoplasm of breast: Secondary | ICD-10-CM | POA: Insufficient documentation

## 2016-09-15 ENCOUNTER — Other Ambulatory Visit: Payer: Self-pay | Admitting: Obstetrics and Gynecology

## 2016-09-15 DIAGNOSIS — N924 Excessive bleeding in the premenopausal period: Secondary | ICD-10-CM

## 2016-09-23 ENCOUNTER — Ambulatory Visit (INDEPENDENT_AMBULATORY_CARE_PROVIDER_SITE_OTHER): Payer: BC Managed Care – PPO

## 2016-09-23 ENCOUNTER — Ambulatory Visit: Payer: BC Managed Care – PPO | Admitting: Obstetrics and Gynecology

## 2016-09-23 ENCOUNTER — Encounter: Payer: Self-pay | Admitting: Obstetrics and Gynecology

## 2016-09-23 ENCOUNTER — Telehealth: Payer: Self-pay | Admitting: Obstetrics and Gynecology

## 2016-09-23 VITALS — BP 130/82 | HR 68 | Ht 63.5 in | Wt 175.0 lb

## 2016-09-23 DIAGNOSIS — Z124 Encounter for screening for malignant neoplasm of cervix: Secondary | ICD-10-CM

## 2016-09-23 DIAGNOSIS — N924 Excessive bleeding in the premenopausal period: Secondary | ICD-10-CM | POA: Diagnosis not present

## 2016-09-23 DIAGNOSIS — Z01419 Encounter for gynecological examination (general) (routine) without abnormal findings: Secondary | ICD-10-CM

## 2016-09-23 DIAGNOSIS — R935 Abnormal findings on diagnostic imaging of other abdominal regions, including retroperitoneum: Secondary | ICD-10-CM

## 2016-09-23 DIAGNOSIS — Z1239 Encounter for other screening for malignant neoplasm of breast: Secondary | ICD-10-CM

## 2016-09-23 DIAGNOSIS — N8501 Benign endometrial hyperplasia: Secondary | ICD-10-CM

## 2016-09-23 MED ORDER — MEGESTROL ACETATE 40 MG PO TABS: 40.0000 mg | ORAL_TABLET | Freq: Every day | ORAL | 11 refills | Status: DC

## 2016-09-23 NOTE — Patient Instructions (Signed)
Endometrial Hyperplasia Endometrial hyperplasia is abnormal thickening of the tissue that lines the inside of the uterus (endometrium). This condition can also cause cell changes (atypia) that can lead to endometrial cancer. There are four types of endometrial hyperplasia:  Endometrial thickening only (simple hyperplasia).  Endometrial thickening and crowding of cells (complex hyperplasia).  Endometrial thickening and cell changes (simple atypical hyperplasia). This type has a low risk of becoming cancerous.  Endometrial thickening, cell crowding, and cell changes (complex atypical hyperplasia). This type has a higher risk of becoming cancerous. Early diagnosis and treatment are important to reduce the risk of cancer. What are the causes? This condition is caused by an imbalance of the reproductive hormones estrogen and progesterone. At the beginning of your menstrual cycle, your ovaries make estrogen. This thickens the lining of your uterus to prepare for pregnancy. If pregnancy does not occur, your estrogen level drops. Then, the hormone progesterone prompts your body to shed the lining of your uterus. This is your menstrual period. Endometrial hyperplasia results from too much estrogen and not enough progesterone. What increases the risk? The following factors may make you more likely to develop this condition:  Being older than 47 years of age. The condition is most common just before or after menopause. Menopause means 12 months without a menstrual period.  Taking an estrogen medicine or a medicine that acts like estrogen.  Having polycystic ovary syndrome (PCOS).  Being overweight.  Smoking.  Having diabetes.  Having irregular periods.  Having started periods early.  Having started menopause late.  Never having been pregnant.  Having a family history of uterine, ovarian, or colon cancer.  Having other diseases such as gallbladder or thyroid disease. What are the signs or  symptoms? The main symptom of this condition is abnormal vaginal bleeding. The bleeding may:  Be heavier and longer during menstrual periods.  Happen more often than a normal menstrual cycle.  Occur after menopause. How is this diagnosed? This condition may be diagnosed based on:  Your symptoms and medical history, including risk factors.  A physical exam.  Tests, such as:  An imaging study done with sound waves (transvaginal ultrasound) to check the endometrium. This test uses a sound wave probe that is inserted into your vagina.  A procedure to take a sample of endometrial tissue through the vagina so it can be looked at under a microscope (endometrial biopsy).  A procedure in which tissue is scraped or suctioned from the inside of the uterus (dilation and curettage).  A procedure in which a thin, lighted device is inserted into the uterus through the cervix to see inside the uterus (hysteroscopy). How is this treated? Treatment for this condition depends on the type of hyperplasia that you have. Synthetic progesterone (progestin) is the most common treatment. Progestin can be given as a pill, injection, or vaginal cream, or as a device that is implanted in your uterus (intrauterine device, IUD). If you have hyperplasia with atypia, you may need surgery to remove your uterus (hysterectomy). You may be more likely to have this procedure if:  You have complex atypical hyperplasia.  You are past menopause.  You do not want to become pregnant. Follow these instructions at home:  Take over-the-counter and prescription medicines only as told by your health care provider.  Do not use any products that contain nicotine or tobacco, such as cigarettes and e-cigarettes. If you need help quitting, ask your health care provider.  Lose weight if you are overweight. Ask your   health care provider to recommend a diet and exercise program to help you reach and maintain a healthy  weight.  Keep all follow-up visits as told by your health care provider. This is important. Contact a health care provider if:  You have periods that are heavier or last longer than usual.  You have your period more often than usual.  You have vaginal bleeding after menopause. Get help right away if:  You have vaginal bleeding that is so heavy that you need to change your tampon or sanitary napkin after less than 2 hours.  You pass blood clots that are larger than the size of a quarter. Summary  Endometrial hyperplasia is abnormal thickening of the tissue that lines the inside of the uterus (endometrium).  The main symptom of this condition is abnormal vaginal bleeding. This may be heavier, longer, or more frequent periods, or it may be vaginal bleeding after menopause.  Some types of hyperplasia also cause cell changes (atypia) that can lead to cancer.  Always let your health care provider know about abnormal vaginal bleeding.  Early diagnosis and treatment are important to reduce the risk of cancer. This information is not intended to replace advice given to you by your health care provider. Make sure you discuss any questions you have with your health care provider. Document Released: 03/21/2016 Document Revised: 03/21/2016 Document Reviewed: 03/21/2016 Elsevier Interactive Patient Education  2017 [employer]  

## 2016-09-23 NOTE — Addendum Note (Signed)
Addended by: Lorrene Reid on: 09/23/2016 10:06 AM   Modules accepted: Orders

## 2016-09-23 NOTE — Progress Notes (Addendum)
Gynecology Annual Exam  PCP: Corky Downs, MD  Chief Complaint:  Chief Complaint  Patient presents with  . Anemia  . Follow-up    Hyperplasia    History of Present Illness: Patient is a 46 y.o. G0P0000 presents for annual exam. The patient has no complaints today.   LMP: Patient's last menstrual period was 09/13/2016 (exact date). Average Interval: mostly absent on megace short 3 days periods irregular intervals Duration of flow: 3 days Heavy Menses: no Clots: no Intermenstrual Bleeding: no Postcoital Bleeding: yes Dysmenorrhea: no   The patient is sexually active. She currently uses on megace for contraception. She denies dyspareunia.  The patient does perform self breast exams.  There is no notable family history of breast or ovarian cancer in her family.  The patient wears seatbelts: yes.   The patient has regular exercise: not asked.    The patient denies current symptoms of depression.    Review of Systems: Review of Systems  Constitutional: Negative for chills and fever.  HENT: Negative for congestion.   Respiratory: Negative for cough and shortness of breath.   Cardiovascular: Negative for chest pain and palpitations.  Gastrointestinal: Negative for abdominal pain, constipation, diarrhea, heartburn, nausea and vomiting.  Genitourinary: Negative for dysuria, frequency and urgency.  Skin: Negative for itching and rash.  Neurological: Negative for dizziness and headaches.  Endo/Heme/Allergies: Negative for polydipsia.  Psychiatric/Behavioral: Negative for depression.    Past Medical History:  Past Medical History:  Diagnosis Date  . Diabetes mellitus    diet controlled  . H/O hypercholesterolemia   . Hypertension     Past Surgical History:  Past Surgical History:  Procedure Laterality Date  . GASTRIC BYPASS  December 13, 2014   [facility]  . HYSTEROSCOPY W/D&C N/A 11/27/2015   Procedure: DILATATION AND CURETTAGE /HYSTEROSCOPY;  Surgeon: Vena Austria, MD;  Location: [facility] ORS;  Service: Gynecology;  Laterality: N/A;    Gynecologic History:  Patient's last menstrual period was 09/13/2016 (exact date). Contraception: none Last Pap: Results were: NIL and HR HPV+ 09/07/15 Last mammogram:  07/03/16 Results were: BI-RAD I Obstetric History: G0P0000  Family History:  Family History  Problem Relation Age of Onset  . Heart attack Mother   . Heart attack Father   . COPD Father   . Breast cancer Neg Hx     Social History:  Social History   Social History  . Marital status: Single    Spouse name: N/A  . Number of children: N/A  . Years of education: N/A   Occupational History  . Not on file.   Social History Main Topics  . Smoking status: Former Smoker    Packs/day: 1.00    Types: Cigarettes    Quit date: 10/22/2011  . Smokeless tobacco: Never Used  . Alcohol use Yes     Comment: social  . Drug use: No  . Sexual activity: Not Currently   Other Topics Concern  . Not on file   Social History Narrative  . No narrative on file    Allergies:  Allergies  Allergen Reactions  . Codeine Shortness Of Breath  . Penicillins Anaphylaxis  . Zyprexa [Olanzapine] Other (See Comments)    Causes Panic attacks Causes Panic attacks  . Liraglutide Nausea And Vomiting  . Sulfonamide Derivatives Rash    Medications: Prior to Admission medications   Medication Sig Start Date End Date Taking? Authorizing Provider  diclofenac sodium (VOLTAREN) 1 % GEL  02/29/16  Yes Historical  Provider, MD  megestrol (MEGACE) 40 MG tablet Take 1 tablet (40 mg total) by mouth daily. 09/23/16  Yes Vena Austria, MD  Azelastine-Fluticasone 137-50 MCG/ACT SUSP Place 1 spray into both nostrils daily as needed.    Historical Provider, MD  betamethasone dipropionate (DIPROLENE) 0.05 % cream  06/25/16   Historical Provider, MD  Biotin 5000 MCG TABS Take 1 tablet by mouth 2 (two) times daily.    Historical Provider, MD  Cholecalciferol (VITAMIN D3) 5000  units TABS Take 1 tablet by mouth daily.    Historical Provider, MD  cyanocobalamin (,VITAMIN B-12,) 1000 MCG/ML injection Inject 1 mL into the muscle every 30 (thirty) days. 09/01/14   Historical Provider, MD  montelukast (SINGULAIR) 10 MG tablet  07/29/16   Historical Provider, MD  Multiple Vitamin (MULTIVITAMIN WITH MINERALS) TABS tablet Take 1 tablet by mouth daily.    Historical Provider, MD  OVER THE COUNTER MEDICATION Take 1-3 tablets by mouth daily. Iron+vitamin c 54/90mg     Historical Provider, MD  OVER THE COUNTER MEDICATION Take 1 tablet by mouth daily. Calcium citrate  + vitamin d3 15units + magnisum     Historical Provider, MD    Physical Exam Vitals: Blood pressure 130/82, pulse 68, height 5' 3.5" (1.613 m), weight 175 lb (79.4 kg), last menstrual period 09/13/2016.  General: NAD HEENT: normocephalic, anicteric Thyroid: no enlargement, no palpable nodules Pulmonary: No increased work of breathing, CTAB Cardiovascular: RRR, distal pulses 2+ Breast: Breast symmetrical, no tenderness, no palpable nodules or masses, no skin or nipple retraction present, no nipple discharge.  No axillary or supraclavicular lymphadenopathy. Abdomen: NABS, soft, non-tender, non-distended.  Umbilicus without lesions.  No hepatomegaly, splenomegaly or masses palpable. No evidence of hernia  Genitourinary:  External: Normal external female genitalia.  Normal urethral meatus, normal  Bartholin's and Skene's glands.    Vagina: Normal vaginal mucosa, no evidence of prolapse.    Cervix: Grossly normal in appearance, no bleeding  Uterus: Non-enlarged, mobile, normal contour.  No CMT  Adnexa: ovaries non-enlarged, no adnexal masses  Rectal: deferred  Lymphatic: no evidence of inguinal lymphadenopathy Extremities: no edema, erythema, or tenderness Neurologic: Grossly intact Psychiatric: mood appropriate, affect full  Female chaperone present for pelvic and breast  portions of the physical  exam    Assessment: 46 y.o. G0P0000 annual exam and follow up of simple endometrial hyperplasia without atypia  Plan: Problem List Items Addressed This Visit    None    Visit Diagnoses    Simple endometrial hyperplasia without atypia    -  Primary   Women's annual routine gynecological examination       Relevant Orders   PapIG, HPV, rfx 16/18   Cervical cancer screening       Relevant Orders   PapIG, HPV, rfx 16/18   Breast cancer screening       Abnormal ultrasound of endometrium          1) Mammogram - recommend yearly screening mammogram.  Mammogram Is up to date  2) Simple endometrial hyperplasia without atypia - has been managed with megace post D&C, some postcoital bleeding, ultrasound today showing thickening of the endometrium possible polyp.  Will proceed with D&C given concern for focal lesion.  Discussed given short interval between this and last D&C hysterectomy reasonable if continued hyperplasia or progression to atypia.    3) ASCCP guidelines and rational discussed.  Patient opts for yearly screening interval  4) Contraception - currently on megace for hyperplasia  5) Routine healthcare maintenance  including cholesterol, diabetes screening discussed managed by PCP

## 2016-09-23 NOTE — Telephone Encounter (Signed)
Pt is calling about an schedule D&C for Next Friday pt is needing to Schedule for another Friday. Please [phone]

## 2016-09-24 NOTE — Telephone Encounter (Signed)
-----   Message from Vena Austria, MD sent at 09/23/2016  9:59 AM EDT ----- Regarding: surgery Surgery Date: 10/03/16  LOS: outpatient  Surgery Booking Request Patient Full Name: Kathryn Owens MRN: [id number]  DOB: 05-15-1971  Surgeon: Vena Austria, MD  Requested Surgery Date and Time: Friday 10/04/15 Primary Diagnosis and Code: Abnormal  Secondary Diagnosis and Code:  Surgical Procedure: Hysteroscopy, D&C L&D Notification:N/A Admission Status: same day surgery Length of Surgery: 1hr Special Case Needs: none H&P: today (date) Phone Interview or Office Pre-Admit: phone Interpreter: N/A Language: English Medical Clearance: no Special Scheduling Instructions: I will do consents day of surgery once surgery is in the system let me know and I'll place preop orders

## 2016-09-24 NOTE — Telephone Encounter (Signed)
I spoke to the patient who decided to have the procedure on Thurs, 10/16/16. Patient is available at any day at noon or 12:30pm for P.A.T. Phone interview. Patient has my ext.

## 2016-09-24 NOTE — Telephone Encounter (Signed)
-----   Message from Andreas Staebler, MD sent at 09/23/2016  9:59 AM EDT ----- °Regarding: surgery °Surgery Date: 10/03/16 ° °LOS: outpatient ° °Surgery Booking Request °Patient Full Name: Kathryn Owens °MRN: [id number]  °DOB: 08/02/1970  °Surgeon: Andreas Staebler, MD  °Requested Surgery Date and Time: Friday 10/04/15 °Primary Diagnosis and Code: Abnormal  °Secondary Diagnosis and Code:  °Surgical Procedure: Hysteroscopy, D&C °L&D Notification:N/A °Admission Status: same day surgery °Length of Surgery: 1hr °Special Case Needs: none °H&P: today (date) °Phone Interview or Office Pre-Admit: phone °Interpreter: N/A °Language: English °Medical Clearance: no °Special Scheduling Instructions: I will do consents day of surgery once surgery is in the system let me know and I'll place preop orders ° °

## 2016-09-24 NOTE — Telephone Encounter (Signed)
Lmtrc

## 2016-09-25 LAB — PAPIG, HPV, RFX 16/18
HPV, high-risk: NEGATIVE
PAP Smear Comment: 0

## 2016-09-26 ENCOUNTER — Encounter: Payer: Self-pay | Admitting: Obstetrics and Gynecology

## 2016-10-09 ENCOUNTER — Encounter
Admission: RE | Admit: 2016-10-09 | Discharge: 2016-10-09 | Disposition: A | Discharge status: Home / Self Care | Payer: BC Managed Care – PPO | Source: Ambulatory Visit | Attending: Obstetrics and Gynecology | Admitting: Obstetrics and Gynecology

## 2016-10-09 HISTORY — DX: Other complications of anesthesia, initial encounter: T88.59XA

## 2016-10-09 HISTORY — DX: Other specified postprocedural states: Z98.890

## 2016-10-09 HISTORY — DX: Nausea with vomiting, unspecified: R11.2

## 2016-10-09 HISTORY — DX: Adverse effect of unspecified anesthetic, initial encounter: T41.45XA

## 2016-10-09 HISTORY — DX: Unspecified osteoarthritis, unspecified site: M19.90

## 2016-10-09 NOTE — Patient Instructions (Signed)
  Your procedure is scheduled on: 10-16-16 Report to Same Day Surgery 2nd floor medical mall [facility] Entrance-take elevator on left to 2nd floor.  Check in with surgery information desk.) To find out your arrival time please call [phone] between 1PM - 3PM on 10-15-16  Remember: Instructions that are not followed completely may result in serious medical risk, up to and including death, or upon the discretion of your surgeon and anesthesiologist your surgery may need to be rescheduled.    _x___ 1. Do not eat food or drink liquids after midnight. No gum chewing or                              hard candies.     __x__ 2. No Alcohol for 24 hours before or after surgery.   __x__3. No Smoking for 24 prior to surgery.   ____  4. Bring all medications with you on the day of surgery if instructed.    __x__ 5. Notify your doctor if there is any change in your medical condition     (cold, fever, infections).     Do not wear jewelry, make-up, hairpins, clips or nail polish.  Do not wear lotions, powders, or perfumes. You may wear deodorant.  Do not shave 48 hours prior to surgery. Men may shave face and neck.  Do not bring valuables to the hospital.    [facility] is not responsible for any belongings or valuables.               Contacts, dentures or bridgework may not be worn into surgery.  Leave your suitcase in the car. After surgery it may be brought to your room.  For patients admitted to the hospital, discharge time is determined by your                       treatment team.   Patients discharged the day of surgery will not be allowed to drive home.  You will need someone to drive you home and stay with you the night of your procedure.    Please read over the following fact sheets that you were given:   [facility] Preparing for Surgery and or MRSA Information   _x___ Take anti-hypertensive (unless it includes a diuretic), cardiac, seizure, asthma,     anti-reflux and psychiatric  medicines. These include:  1. SINGULAIR  2.  3.  4.  5.  6.  ____Fleets enema or Magnesium Citrate as directed.   ____ Use CHG Soap or sage wipes as directed on instruction sheet   ____ Use inhalers on the day of surgery and bring to hospital day of surgery  ____ Stop Metformin and Janumet 2 days prior to surgery.    ____ Take 1/2 of usual insulin dose the night before surgery and none on the morning     surgery.   ____ Follow recommendations from Cardiologist, Pulmonologist or PCP regarding          stopping Aspirin, Coumadin, Pllavix ,Eliquis, Effient, or Pradaxa, and Pletal.  X____Stop Anti-inflammatories such as Advil, Aleve, Ibuprofen, Motrin, Naproxen, Naprosyn, Goodies powders or aspirin products NOW-OK to take Tylenol    _x___ Stop supplements until after surgery-STOP BIOTIN NOW   ____ Bring C-Pap to the hospital.

## 2016-10-16 ENCOUNTER — Ambulatory Visit: Payer: BC Managed Care – PPO | Admitting: Anesthesiology

## 2016-10-16 ENCOUNTER — Ambulatory Visit
Admission: RE | Admit: 2016-10-16 | Discharge: 2016-10-16 | Disposition: A | Discharge status: Home / Self Care | Payer: BC Managed Care – PPO | Source: Ambulatory Visit | Attending: Obstetrics and Gynecology | Admitting: Obstetrics and Gynecology

## 2016-10-16 ENCOUNTER — Encounter: Payer: Self-pay | Admitting: *Deleted

## 2016-10-16 ENCOUNTER — Encounter
Admission: RE | Disposition: A | Discharge status: Home / Self Care | Payer: Self-pay | Source: Ambulatory Visit | Attending: Obstetrics and Gynecology

## 2016-10-16 DIAGNOSIS — Z9889 Other specified postprocedural states: Secondary | ICD-10-CM

## 2016-10-16 DIAGNOSIS — Z87891 Personal history of nicotine dependence: Secondary | ICD-10-CM | POA: Diagnosis not present

## 2016-10-16 DIAGNOSIS — Z9884 Bariatric surgery status: Secondary | ICD-10-CM | POA: Insufficient documentation

## 2016-10-16 DIAGNOSIS — N926 Irregular menstruation, unspecified: Secondary | ICD-10-CM | POA: Diagnosis not present

## 2016-10-16 DIAGNOSIS — N8501 Benign endometrial hyperplasia: Secondary | ICD-10-CM | POA: Insufficient documentation

## 2016-10-16 HISTORY — PX: HYSTEROSCOPY W/D&C: SHX1775

## 2016-10-16 LAB — POCT PREGNANCY, URINE: Preg Test, Ur: NEGATIVE

## 2016-10-16 SURGERY — DILATATION AND CURETTAGE /HYSTEROSCOPY
Anesthesia: General

## 2016-10-16 MED ORDER — LIDOCAINE HCL (CARDIAC) 20 MG/ML IV SOLN
INTRAVENOUS | Status: DC | PRN
  Administered 2016-10-16: 30 mg via INTRAVENOUS

## 2016-10-16 MED ORDER — DEXAMETHASONE SODIUM PHOSPHATE 10 MG/ML IJ SOLN
INTRAMUSCULAR | Status: DC | PRN
  Administered 2016-10-16: 5 mg via INTRAVENOUS

## 2016-10-16 MED ORDER — SCOPOLAMINE 1 MG/3DAYS TD PT72
MEDICATED_PATCH | TRANSDERMAL | Status: AC
  Filled 2016-10-16: qty 1

## 2016-10-16 MED ORDER — FENTANYL CITRATE (PF) 100 MCG/2ML IJ SOLN
INTRAMUSCULAR | Status: DC | PRN
  Administered 2016-10-16 (×3): 50 ug via INTRAVENOUS

## 2016-10-16 MED ORDER — FENTANYL CITRATE (PF) 100 MCG/2ML IJ SOLN: 25.0000 ug | INTRAMUSCULAR | Status: DC | PRN

## 2016-10-16 MED ORDER — PROMETHAZINE HCL 25 MG/ML IJ SOLN: 6.2500 mg | INTRAMUSCULAR | Status: DC | PRN

## 2016-10-16 MED ORDER — MIDAZOLAM HCL 2 MG/2ML IJ SOLN
INTRAMUSCULAR | Status: DC | PRN
  Administered 2016-10-16: 2 mg via INTRAVENOUS

## 2016-10-16 MED ORDER — FENTANYL CITRATE (PF) 250 MCG/5ML IJ SOLN
INTRAMUSCULAR | Status: AC
  Filled 2016-10-16: qty 5

## 2016-10-16 MED ORDER — LACTATED RINGERS IV SOLN
INTRAVENOUS | Status: DC
  Administered 2016-10-16: 11:00:00 via INTRAVENOUS

## 2016-10-16 MED ORDER — ONDANSETRON HCL 4 MG/2ML IJ SOLN
INTRAMUSCULAR | Status: AC
  Filled 2016-10-16: qty 2

## 2016-10-16 MED ORDER — DEXAMETHASONE SODIUM PHOSPHATE 10 MG/ML IJ SOLN
INTRAMUSCULAR | Status: AC
  Filled 2016-10-16: qty 1

## 2016-10-16 MED ORDER — PROPOFOL 500 MG/50ML IV EMUL
INTRAVENOUS | Status: AC
  Filled 2016-10-16: qty 50

## 2016-10-16 MED ORDER — LIDOCAINE HCL (PF) 2 % IJ SOLN
INTRAMUSCULAR | Status: AC
  Filled 2016-10-16: qty 2

## 2016-10-16 MED ORDER — HYDROCODONE-ACETAMINOPHEN 5-325 MG PO TABS: 1.0000 | ORAL_TABLET | Freq: Four times a day (QID) | ORAL | 0 refills | Status: AC | PRN

## 2016-10-16 MED ORDER — ONDANSETRON HCL 4 MG/2ML IJ SOLN
INTRAMUSCULAR | Status: DC | PRN
Start: 2016-10-16 — End: 2016-10-16
  Administered 2016-10-16: 4 mg via INTRAVENOUS

## 2016-10-16 MED ORDER — SCOPOLAMINE 1 MG/3DAYS TD PT72
1.0000 | MEDICATED_PATCH | Freq: Once | TRANSDERMAL | Status: DC
  Administered 2016-10-16: 1.5 mg via TRANSDERMAL

## 2016-10-16 MED ORDER — PROPOFOL 10 MG/ML IV BOLUS
INTRAVENOUS | Status: AC
  Filled 2016-10-16: qty 20

## 2016-10-16 MED ORDER — MIDAZOLAM HCL 2 MG/2ML IJ SOLN
INTRAMUSCULAR | Status: AC
  Filled 2016-10-16: qty 2

## 2016-10-16 MED ORDER — PROPOFOL 500 MG/50ML IV EMUL
INTRAVENOUS | Status: DC | PRN
  Administered 2016-10-16: 140 ug/kg/min via INTRAVENOUS

## 2016-10-16 MED ORDER — LACTATED RINGERS IR SOLN
Status: DC | PRN
  Administered 2016-10-16: 2000 mL

## 2016-10-16 SURGICAL SUPPLY — 17 items
CATH ROBINSON RED A/P 16FR (CATHETERS) ×3 IMPLANT
ELECT REM PT RETURN 9FT ADLT (ELECTROSURGICAL) ×3
ELECTRODE REM PT RTRN 9FT ADLT (ELECTROSURGICAL) ×1 IMPLANT
GLOVE BIO SURGEON STRL SZ7 (GLOVE) ×3 IMPLANT
GLOVE INDICATOR 7.5 STRL GRN (GLOVE) ×3 IMPLANT
GOWN STRL REUS W/ TWL LRG LVL3 (GOWN DISPOSABLE) ×2 IMPLANT
GOWN STRL REUS W/TWL LRG LVL3 (GOWN DISPOSABLE) ×6
IV LACTATED RINGERS 1000ML (IV SOLUTION) ×3 IMPLANT
KIT RM TURNOVER CYSTO AR (KITS) ×3 IMPLANT
MYOSURE LITE POLYP REMOVAL (MISCELLANEOUS) ×2 IMPLANT
PACK DNC HYST (MISCELLANEOUS) ×3 IMPLANT
PAD OB MATERNITY 4.3X12.25 (PERSONAL CARE ITEMS) ×3 IMPLANT
PAD PREP 24X41 OB/GYN DISP (PERSONAL CARE ITEMS) ×3 IMPLANT
SEAL ROD LENS SCOPE MYOSURE (ABLATOR) ×2 IMPLANT
TOWEL OR 17X26 4PK STRL BLUE (TOWEL DISPOSABLE) ×3 IMPLANT
TUBING CONNECTING 10 (TUBING) ×2 IMPLANT
TUBING CONNECTING 10' (TUBING) ×1

## 2016-10-16 NOTE — Anesthesia Post-op Follow-up Note (Cosign Needed)
Anesthesia QCDR form completed.        

## 2016-10-16 NOTE — Op Note (Signed)
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  Ultrasound revealed abnormal contour to endometrial stripe with cystic spaces.    Anesthesia: LMA  Primary Surgeon: Vena Austria, MD  Assistant: none  Preoperative Antibiotics: none  Estimated Blood Loss: 5mL  IV Fluids: 90mL  Urine Output:: ~53mL straight cath  Drains or Tubes: none  Implants: none  Specimens Removed: endometrial curettings  Complications: none  Intraoperative Findings:  Normal cervix, normal endometrial cavity and tubal ostia.  No polyps or other endometrial abnormalities visualized.  On dilation of the cervix which had some slight stenosis I did note some dark old blood clot which may explain her imaging findings.  Patient Condition: stable  Procedure in Detail:  Patient was taken to the operating room were she was administered general endotracheal anesthesia.  She was positioned in the dorsal lithotomy position utilizing Allen stirups, prepped and draped in the usual sterile fashion.  Uterus was noted to be non-enlarged in size, retroverted.   Prior to proceeding with the case a time out was performed.  Attention was turned to the patient's pelvis.  A red rubber catheter was used to empty the patient's bladder.  An operative speculum was placed to allow visualization of the cervix.  The anterior lip of the cervix was grasped with a single tooth tenaculum and the cervix was sequentially dilated using pratt dilators.  The hysteroscope  was then advanced into the uterine cavity noting the above findings.  Curettage was performed using the Myosure with resulting specimen collected and sent to pathology.    The single tooth tenaculum was removed from the cervix.  The tenaculum sites and cervix were noted to be  Hemostatic before removing the operative speculum.  Sponge needle and instrument counts were corrects times two.  The patient tolerated the procedure well and was taken to the recovery room in stable condition.

## 2016-10-16 NOTE — Anesthesia Preprocedure Evaluation (Signed)
Anesthesia Evaluation  Patient identified by MRN, date of birth, ID band Patient awake    Reviewed: Allergy & Precautions, H&P , NPO status , Patient's Chart, lab work & pertinent test results, reviewed documented beta blocker date and time   History of Anesthesia Complications (+) PONV and history of anesthetic complications  Airway Mallampati: I  TM Distance: >3 FB Neck ROM: full    Dental  (+) Partial Upper, Missing, Dental Advidsory Given   Pulmonary neg pulmonary ROS, former smoker,    Pulmonary exam normal breath sounds clear to auscultation       Cardiovascular Exercise Tolerance: Good hypertension (not on medications), (-) angina(-) CAD, (-) Past MI, (-) Cardiac Stents and (-) CABG (-) dysrhythmias (-) Valvular Problems/Murmurs     Neuro/Psych negative neurological ROS  negative psych ROS   GI/Hepatic negative GI ROS, Neg liver ROS,   Endo/Other  diabetes (h/o, none since gastric bypass)  Renal/GU negative Renal ROS  negative genitourinary   Musculoskeletal   Abdominal   Peds  Hematology negative hematology ROS (+)   Anesthesia Other Findings Past Medical History: No date: Arthritis     Comment: FOOT LEFT AND RIGHT SHOULDER No date: Complication of anesthesia No date: Diabetes mellitus     Comment: H/O CONTROLLED SINCE GASTRIC BYPASS-NO MEDS No date: H/O hypercholesterolemia No date: Hypertension     Comment: H/O BP CONTROLLED SINCE GASTRIC BYPASS-NO MEDS No date: PONV (postoperative nausea and vomiting)   Reproductive/Obstetrics negative OB ROS                             Anesthesia Physical Anesthesia Plan  ASA: II  Anesthesia Plan: General   Post-op Pain Management:    Induction:   Airway Management Planned:   Additional Equipment:   Intra-op Plan:   Post-operative Plan:   Informed Consent: I have reviewed the patients History and Physical, chart, labs and  discussed the procedure including the risks, benefits and alternatives for the proposed anesthesia with the patient or authorized representative who has indicated his/her understanding and acceptance.   Dental Advisory Given  Plan Discussed with: Anesthesiologist, CRNA and Surgeon  Anesthesia Plan Comments:         Anesthesia Quick Evaluation

## 2016-10-16 NOTE — Anesthesia Procedure Notes (Signed)
Performed by: COOK-MARTIN, Ritamarie Arkin Pre-anesthesia Checklist: Patient identified, Emergency Drugs available, Suction available, Patient being monitored and Timeout performed Patient Re-evaluated:Patient Re-evaluated prior to inductionOxygen Delivery Method: Simple face mask Preoxygenation: Pre-oxygenation with 100% oxygen Intubation Type: IV induction Ventilation: Oral airway inserted - appropriate to patient size Placement Confirmation: positive ETCO2 and CO2 detector        

## 2016-10-16 NOTE — H&P (Signed)
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   History reviewed, patient examined, no change in status, stable for surgery.

## 2016-10-16 NOTE — Transfer of Care (Signed)
Immediate Anesthesia Transfer of Care Note  Patient: Kathryn Owens  Procedure(s) Performed: Procedure(s): DILATATION AND CURETTAGE /HYSTEROSCOPY (N/A)  Patient Location: PACU  Anesthesia Type:General  Level of Consciousness: awake, alert , oriented and sedated  Airway & Oxygen Therapy: Patient Spontanous Breathing and Patient connected to face mask oxygen  Post-op Assessment: Report given to RN and Post -op Vital signs reviewed and stable  Post vital signs: Reviewed and stable  Last Vitals:  Vitals:   10/16/16 1101  BP: 114/65  Pulse: 72  Resp: 18  Temp: 36.2 C    Last Pain:  Vitals:   10/16/16 1101  TempSrc: Tympanic         Complications: No apparent anesthesia complications

## 2016-10-16 NOTE — Discharge Instructions (Signed)

## 2016-10-17 ENCOUNTER — Encounter: Payer: Self-pay | Admitting: Obstetrics and Gynecology

## 2016-10-17 LAB — SURGICAL PATHOLOGY

## 2016-10-17 NOTE — Anesthesia Postprocedure Evaluation (Signed)
Anesthesia Post Note  Patient: Kathryn Owens  Procedure(s) Performed: Procedure(s) (LRB): DILATATION AND CURETTAGE /HYSTEROSCOPY (N/A)  Patient location during evaluation: PACU Anesthesia Type: General Level of consciousness: awake and alert Pain management: pain level controlled Vital Signs Assessment: post-procedure vital signs reviewed and stable Respiratory status: spontaneous breathing, nonlabored ventilation, respiratory function stable and patient connected to nasal cannula oxygen Cardiovascular status: blood pressure returned to baseline and stable Postop Assessment: no signs of nausea or vomiting Anesthetic complications: no     Last Vitals:  Vitals:   10/16/16 1346 10/16/16 1402  BP: 132/75 128/63  Pulse: (!) 47 62  Resp: 14 18  Temp: 36.7 C 37.1 C    Last Pain:  Vitals:   10/17/16 0825  TempSrc:   PainSc: 0-No pain                 Lenard Simmer

## 2016-10-23 ENCOUNTER — Ambulatory Visit (INDEPENDENT_AMBULATORY_CARE_PROVIDER_SITE_OTHER): Payer: BC Managed Care – PPO | Admitting: Obstetrics and Gynecology

## 2016-10-23 ENCOUNTER — Ambulatory Visit: Payer: BC Managed Care – PPO | Admitting: Obstetrics and Gynecology

## 2016-10-23 ENCOUNTER — Encounter: Payer: Self-pay | Admitting: Obstetrics and Gynecology

## 2016-10-23 VITALS — BP 138/78 | HR 78 | Wt 176.0 lb

## 2016-10-23 DIAGNOSIS — Z4889 Encounter for other specified surgical aftercare: Secondary | ICD-10-CM

## 2016-10-23 NOTE — Progress Notes (Signed)
      Postoperative Follow-up Patient presents post op from hysteroscopy, D&C 1weeks ago for abnormal endometrial stripe on ultrasound in setting of history of simple hyperplasia with atypia managed with megace.  Subjective: Patient reports marked improvement in her preop symptoms. Eating a regular diet without difficulty. The patient is not having any pain.  Activity: normal activities of daily living.  Objective: Vitals:   10/23/16 1521  BP: 138/78  Pulse: 78     Assessment: 46 y.o. s/p hysteroscopy, D&C progressing well  Plan: Patient has done well after surgery with no apparent complications.  I have discussed the post-operative course to date, and the expected progress moving forward.  The patient understands what complications to be concerned about.  I will see the patient in routine follow up, or sooner if needed.    Activity plan: No restriction.  - Stop megace given normal pathology findings and monitor bleeding pattern  Vena Austriandreas Tocarra Gassen 10/23/2016, 5:45 PM

## 2016-12-08 ENCOUNTER — Encounter: Payer: Self-pay | Admitting: Obstetrics and Gynecology

## 2016-12-08 ENCOUNTER — Ambulatory Visit (INDEPENDENT_AMBULATORY_CARE_PROVIDER_SITE_OTHER): Payer: BC Managed Care – PPO | Admitting: Obstetrics and Gynecology

## 2016-12-08 VITALS — BP 130/80 | HR 75 | Wt 180.0 lb

## 2016-12-08 DIAGNOSIS — Z4889 Encounter for other specified surgical aftercare: Secondary | ICD-10-CM

## 2016-12-08 MED ORDER — MEDROXYPROGESTERONE ACETATE 10 MG PO TABS: 10.0000 mg | ORAL_TABLET | Freq: Every day | ORAL | 6 refills | Status: AC

## 2016-12-08 NOTE — Progress Notes (Signed)
      Postoperative Follow-up Patient presents post op from hysteroscopy, D&C on 10/16/2016 for abnormal appearance of endometrium on ultrasound, pathology no evidence of hyperplasia or atypia, progestin effect present.  Subjective: Patient reports marked improvement in her preop symptoms. Eating a regular diet without difficulty. The patient is not having any pain.  Activity: normal activities of daily living.  Has had one menstrual cycle since surgery.  Slightly longer at 10 days but not particular heavy.  Objective: Vitals:   12/08/16 1541  BP: 130/80  Pulse: 75   Gen: NAD Abdomen: soft, non-tender GU: normal external female genital. Cervix well healed, uterus non-enlarged, non-tender, no adnexal masses Ext: no edema  Assessment: 46 y.o. s/p hysteroscopy, D&C stable  Plan: Patient has done well after surgery with no apparent complications.  I have discussed the post-operative course to date, and the expected progress moving forward.  The patient understands what complications to be concerned about.  I will see the patient in routine follow up, or sooner if needed.    Activity plan: No restriction.  Given pathology continuous megace stopped, provera prn for withdrawal bleed, discussed mirena IUD  Kathryn Owens 12/09/2016, 1:11 PM

## 2017-06-11 ENCOUNTER — Other Ambulatory Visit: Payer: Self-pay | Admitting: Internal Medicine

## 2017-06-11 DIAGNOSIS — Z1231 Encounter for screening mammogram for malignant neoplasm of breast: Secondary | ICD-10-CM

## 2017-07-21 ENCOUNTER — Ambulatory Visit
Admission: RE | Admit: 2017-07-21 | Discharge: 2017-07-21 | Disposition: A | Discharge status: Home / Self Care | Payer: BC Managed Care – PPO | Source: Ambulatory Visit | Attending: Internal Medicine | Admitting: Internal Medicine

## 2017-07-21 ENCOUNTER — Encounter: Payer: Self-pay | Admitting: Radiology

## 2017-07-21 DIAGNOSIS — Z1231 Encounter for screening mammogram for malignant neoplasm of breast: Secondary | ICD-10-CM | POA: Diagnosis not present

## 2023-10-20 LAB — EXTERNAL GENERIC LAB PROCEDURE: COLOGUARD: NEGATIVE
